# Patient Record
Sex: Male | Born: 1960 | Race: Black or African American | Hispanic: No | Smoking: Current every day smoker
Health system: Southern US, Community
[De-identification: ages and names within clinical notes are randomized; demographics above are authoritative.]

---

## 2015-03-26 ENCOUNTER — Ambulatory Visit (INDEPENDENT_AMBULATORY_CARE_PROVIDER_SITE_OTHER): Payer: Self-pay | Admitting: Family Medicine

## 2015-03-26 ENCOUNTER — Ambulatory Visit (INDEPENDENT_AMBULATORY_CARE_PROVIDER_SITE_OTHER): Payer: Self-pay

## 2015-03-26 VITALS — BP 130/100 | HR 97 | Temp 97.7°F | Resp 17 | Ht 71.65 in | Wt 157.0 lb

## 2015-03-26 DIAGNOSIS — S6991XA Unspecified injury of right wrist, hand and finger(s), initial encounter: Secondary | ICD-10-CM

## 2015-03-26 NOTE — Patient Instructions (Addendum)
Please keep your thumb elevated as much as possible, use ice off and on for 15-20 minutes at a time for the next 24 hours.   Tylenol/ibuprofen for pain    IF you received an x-ray today, you will receive [facility]. Please contact [facility] at [phone] with questions or concerns regarding your invoice.   IF you received labwork today, you will receive an invoice from [facility]. Please contact [facility] at [phone] with questions or concerns regarding your invoice.   Our billing staff will not be able to assist you with questions regarding bills from these companies.  You will be contacted with the lab results as soon as they are available. The fastest way to get your results is to activate your My Chart account. Instructions are located on the last page of this paperwork. If you have not heard from us regarding the results in 2 weeks, please contact this office.

## 2015-03-26 NOTE — Progress Notes (Signed)
   Subjective:    Patient ID: Alejandro RastMichael Gassett, male    DOB: 04/07/1960, 55 y.o.   MRN: [id number]  HPI This is a 55 year old male that presents today with swelling and burning to right thumb. Pt reports that he hit his thumb with a hammer at work 2 days ago. Pt reports pain is 10/10.  He is tried extra strength Excedrin (3) with no relief. Pt reports that the pain is worst at night and is preventing him from sleeping.     No past medical history on file. No family history on file. Social History   Social History  . Marital Status: Unknown    Spouse Name: N/A  . Number of Children: N/A  . Years of Education: N/A   Occupational History  . Not on file.   Social History Main Topics  . Smoking status: Current Every Day Smoker -- 0.20 packs/day for 20 years    Types: Cigarettes  . Smokeless tobacco: Not on file  . Alcohol Use: Not on file  . Drug Use: Not on file  . Sexual Activity: Not on file   Other Topics Concern  . Not on file   Social History Narrative  . No narrative on file    Review of Systems  Constitutional: Positive for activity change (hard to lift things with right hand). Negative for fever and fatigue.  HENT: Negative for congestion.        Right swollen thumb  Respiratory: Negative for cough, chest tightness and shortness of breath.   Cardiovascular: Negative for chest pain.  Neurological: Positive for numbness (to upper part of thumb).  Psychiatric/Behavioral: Positive for sleep disturbance (due to pain).       Objective:   Physical Exam  Constitutional: He is oriented to person, place, and time. He appears well-developed and well-nourished.  HENT:  Head: Normocephalic and atraumatic.  Cardiovascular: Normal rate, regular rhythm and normal heart sounds.   Pulmonary/Chest: Effort normal and breath sounds normal. He has no wheezes.  Musculoskeletal: He exhibits edema (right distal thumb) and tenderness (right distal thumb).       Right hand: He exhibits  decreased range of motion, tenderness, decreased capillary refill and swelling. Normal sensation noted. Decreased strength noted. He exhibits thumb/finger opposition.  Right distal thumb swelling  Neurological: He is alert and oriented to person, place, and time. He has normal reflexes.  Skin: Skin is warm. There is erythema (right distal thumb, swollen +2).  Psychiatric: He has a normal mood and affect. Judgment and thought content normal.        BP 130/100 mmHg  Pulse 97  Temp(Src) 97.7 F (36.5 C) (Oral)  Resp 17  Ht 5' 11.65" (1.82 m)  Wt 157 lb (71.215 kg)  BMI 21.50 kg/m2  SpO2 98%  Assessment & Plan:  1. Thumb injury, right, initial encounter - DG Finger Thumb Right; Future -pain medication administered  -apply ice as needed for swelling -elevate arm above the heart until swelling decreases -take ibuprofen or tylenol as needed for pain  RTC precautions reviewed  Marlon Pelora Linzie Boursiquot, FNP-student  [facility], [facility]  03/26/2015 1:51 PM

## 2015-03-26 NOTE — Progress Notes (Signed)
   Subjective:    Patient ID: Alejandro Joseph, male    DOB: 11/19/1960, 55 y.o.   MRN: [id number]  HPI This is a 55 yo male who presents today with right thumb injury. He hit his thumb with a hammer yesterday while at work. He has had pain and swelling since yesterday. He did not have any bleeding. He took Excedrin 3 tablets without relief. No ice.    Has not had elevated BP in past.   No past medical history on file. No past surgical history on file. No family history on file. Social History  Substance Use Topics  . Smoking status: Current Every Day Smoker -- 0.20 packs/day for 20 years    Types: Cigarettes  . Smokeless tobacco: None  . Alcohol Use: None      Review of Systems +pain, + swelling    Objective:   Physical Exam  Constitutional: He is oriented to person, place, and time. He appears well-developed and well-nourished.  HENT:  Head: Normocephalic and atraumatic.  Eyes: Conjunctivae are normal.  Neck: Normal range of motion. Neck supple.  Cardiovascular: Normal rate.   Pulmonary/Chest: Effort normal.  Musculoskeletal:  Right thumb with moderate amount generalized swelling, erythema and ecchymosis. Generalized tenderness. ROM limited by swelling.   Neurological: He is alert and oriented to person, place, and time.  Skin: Skin is warm and dry.  Psychiatric: He has a normal mood and affect. His behavior is normal. Judgment and thought content normal.  Vitals reviewed.    BP 130/100 mmHg  Pulse 97  Temp(Src) 97.7 F (36.5 C) (Oral)  Resp 17  Ht 5' 11.65" (1.82 m)  Wt 157 lb (71.215 kg)  BMI 21.50 kg/m2  SpO2 98% Recheck BP- 134/92  Given ibuprofen 600 mg po in the office Dg Finger Thumb Right  03/26/2015  CLINICAL DATA:  Hit thumb with hammer 2 days ago with persistent pain and swelling, initial encounter EXAM: RIGHT THUMB 2+V COMPARISON:  None. FINDINGS: Degenerative changes at the first MCP joint are seen. No acute fracture or dislocation is noted.  Generalized soft tissue swelling is noted. IMPRESSION: No acute abnormality seen. Electronically Signed   By: Alcide CleverMark  Lukens M.D.   On: 03/26/2015 14:19       Assessment & Plan:  1. Thumb injury, right, initial encounter - DG Finger Thumb Right; Future - Xray negative for fracture/dislocation - Thumb was splinted and wrapped for comfort - Instructions given regarding elevation/ice  Olean Reeeborah Gwyneth Fernandez, FNP-BC  [facility], [facility]  03/29/2015 1:59 PM

## 2016-08-13 IMAGING — CR DG FINGER THUMB 2+V*R*
1 series · 1 of 1 positions shown · non-contrast
Comparison: None.

CLINICAL DATA: Hit thumb with hammer 2 days ago with persistent
pain and swelling, initial encounter

EXAM:
RIGHT THUMB 2+V

[PA]
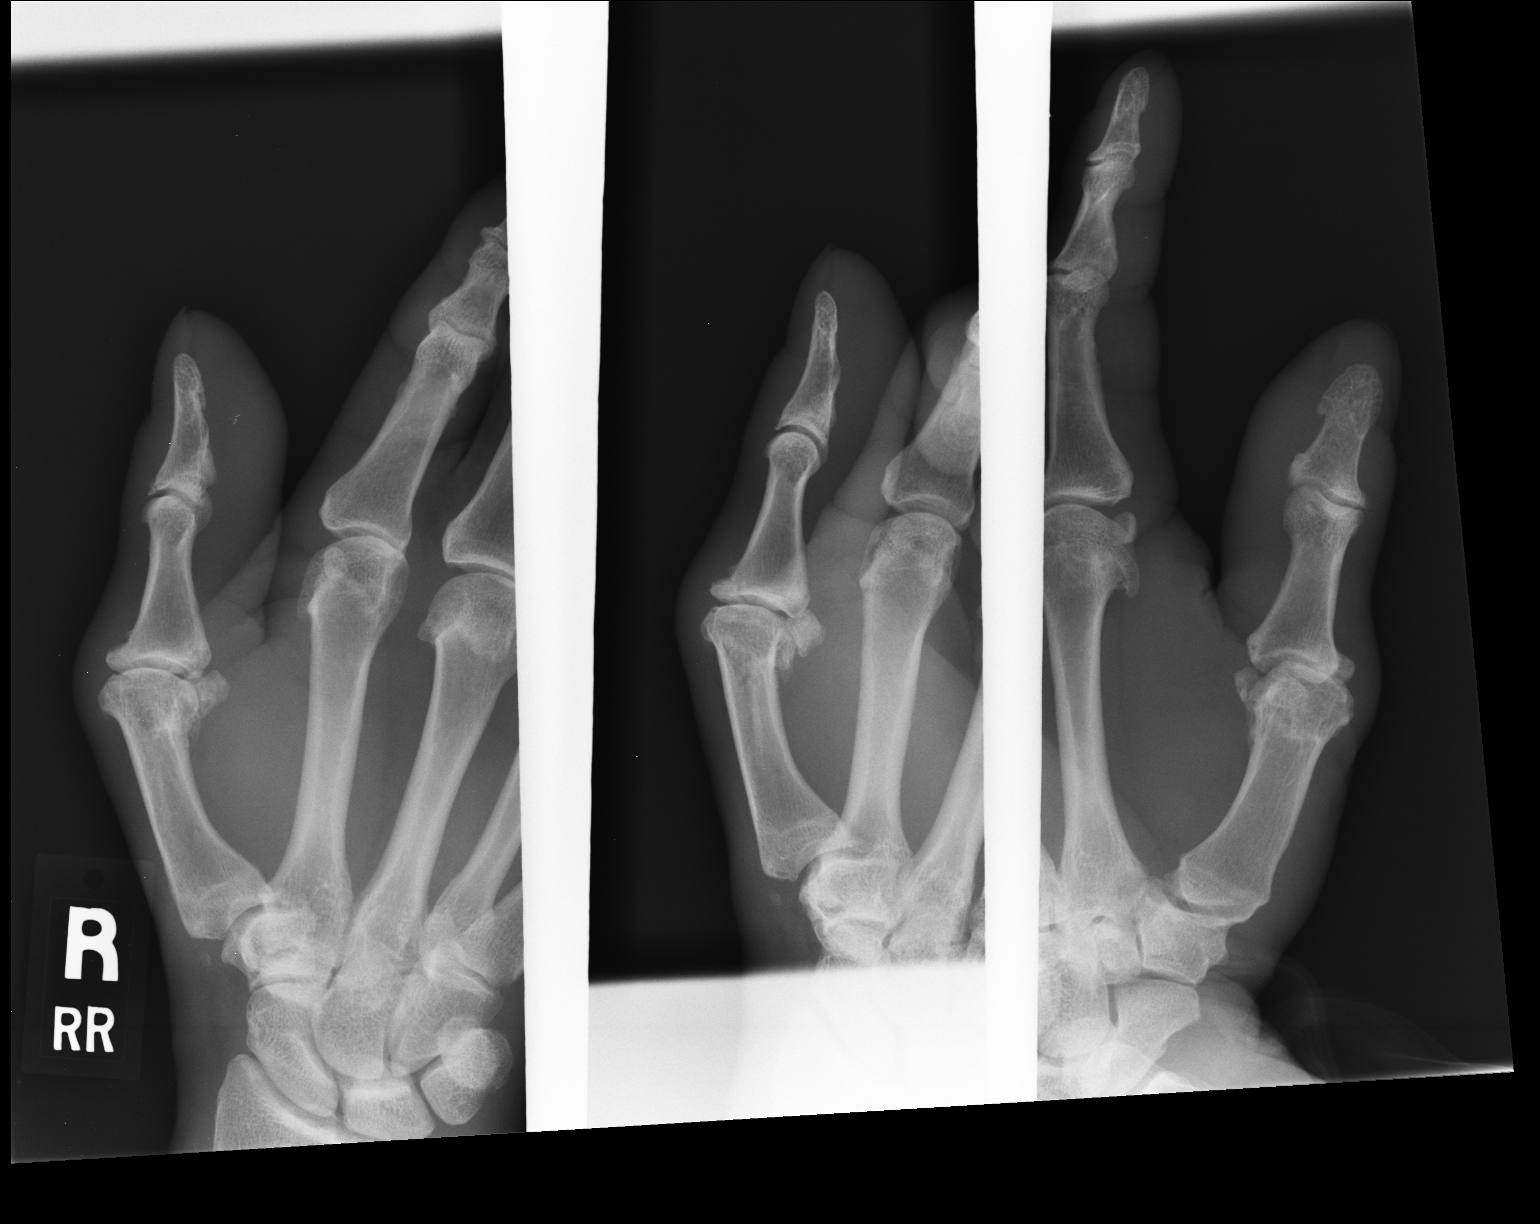

[1 of 1 positions shown; findings below may reference images not displayed]

FINDINGS: Degenerative changes at the first MCP joint are seen. No acute
fracture or dislocation is noted. Generalized soft tissue swelling
is noted.
IMPRESSION: No acute abnormality seen.
